# Patient Record
Sex: Male | Born: 1960 | Race: Black or African American | Hispanic: No | Marital: Single
Health system: Southern US, Community
[De-identification: ages and names within clinical notes are randomized; demographics above are authoritative.]

---

## 2008-09-01 ENCOUNTER — Emergency Department: Payer: Self-pay | Admitting: Emergency Medicine

## 2008-09-03 ENCOUNTER — Emergency Department: Payer: Self-pay | Admitting: Emergency Medicine

## 2009-01-27 ENCOUNTER — Emergency Department: Payer: Self-pay | Admitting: Emergency Medicine

## 2009-02-09 ENCOUNTER — Emergency Department: Payer: Self-pay | Admitting: Internal Medicine

## 2009-07-31 ENCOUNTER — Emergency Department: Payer: Self-pay | Admitting: Internal Medicine
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
## 2011-05-04 ENCOUNTER — Emergency Department: Payer: Self-pay | Admitting: *Deleted

## 2011-08-22 ENCOUNTER — Emergency Department: Payer: Self-pay | Admitting: Emergency Medicine

## 2011-08-22 LAB — CBC
HCT: 46.7 % (ref 40.0–52.0)
HGB: 15.6 g/dL (ref 13.0–18.0)
MCH: 31.1 pg (ref 26.0–34.0)
MCHC: 33.5 g/dL (ref 32.0–36.0)
MCV: 93 fL (ref 80–100)
Platelet: 177 10*3/uL (ref 150–440)
RDW: 13 % (ref 11.5–14.5)
WBC: 7.9 10*3/uL (ref 3.8–10.6)

## 2011-08-22 LAB — COMPREHENSIVE METABOLIC PANEL
Alkaline Phosphatase: 58 U/L (ref 50–136)
BUN: 18 mg/dL (ref 7–18)
Co2: 22 mmol/L (ref 21–32)
Creatinine: 1.17 mg/dL (ref 0.60–1.30)
EGFR (Non-African Amer.): >60
SGPT (ALT): 44 U/L
Sodium: 146 mmol/L — ABNORMAL HIGH (ref 136–145)
Total Protein: 8.2 g/dL (ref 6.4–8.2)

## 2017-08-23 ENCOUNTER — Encounter: Payer: Self-pay | Admitting: Emergency Medicine

## 2017-08-23 ENCOUNTER — Emergency Department
Admission: EM | Admit: 2017-08-23 | Discharge: 2017-08-23 | Disposition: A | Discharge status: Home / Self Care | Payer: No Typology Code available for payment source | Attending: Emergency Medicine | Admitting: Emergency Medicine

## 2017-08-23 DIAGNOSIS — Y999 Unspecified external cause status: Secondary | ICD-10-CM | POA: Insufficient documentation

## 2017-08-23 DIAGNOSIS — S3992XA Unspecified injury of lower back, initial encounter: Secondary | ICD-10-CM | POA: Diagnosis present

## 2017-08-23 DIAGNOSIS — Y939 Activity, unspecified: Secondary | ICD-10-CM | POA: Insufficient documentation

## 2017-08-23 DIAGNOSIS — S39012A Strain of muscle, fascia and tendon of lower back, initial encounter: Secondary | ICD-10-CM

## 2017-08-23 DIAGNOSIS — Y929 Unspecified place or not applicable: Secondary | ICD-10-CM | POA: Insufficient documentation

## 2017-08-23 MED ORDER — MELOXICAM 15 MG PO TABS: 15.0000 mg | ORAL_TABLET | Freq: Every day | ORAL | 0 refills | Status: AC

## 2017-08-23 MED ORDER — METHOCARBAMOL 500 MG PO TABS: 500.0000 mg | ORAL_TABLET | Freq: Four times a day (QID) | ORAL | 0 refills | Status: AC

## 2017-08-23 MED ORDER — MELOXICAM 7.5 MG PO TABS
15.0000 mg | ORAL_TABLET | Freq: Once | ORAL | Status: AC
  Administered 2017-08-23: 15 mg via ORAL
  Filled 2017-08-23: qty 2
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
